# Patient Record
Sex: Female | Born: 1960 | Race: White | Hispanic: No | Smoking: Current every day smoker
Health system: Southern US, Community
[De-identification: ages and names within clinical notes are randomized; demographics above are authoritative.]

## PROBLEM LIST (undated history)

## (undated) DIAGNOSIS — Z86010 Personal history of colonic polyps: Secondary | ICD-10-CM

## (undated) DIAGNOSIS — Z8619 Personal history of other infectious and parasitic diseases: Secondary | ICD-10-CM

## (undated) DIAGNOSIS — E785 Hyperlipidemia, unspecified: Secondary | ICD-10-CM

## (undated) DIAGNOSIS — K219 Gastro-esophageal reflux disease without esophagitis: Secondary | ICD-10-CM

## (undated) HISTORY — DX: Personal history of colonic polyps: Z86.010

## (undated) HISTORY — DX: Personal history of other infectious and parasitic diseases: Z86.19

## (undated) HISTORY — DX: Gastro-esophageal reflux disease without esophagitis: K21.9

## (undated) HISTORY — DX: Hyperlipidemia, unspecified: E78.5

---
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
## 1999-08-05 ENCOUNTER — Inpatient Hospital Stay (HOSPITAL_COMMUNITY): Admission: AD | Admit: 1999-08-05 | Discharge: 1999-08-05 | Payer: Self-pay | Admitting: Obstetrics & Gynecology

## 1999-10-08 ENCOUNTER — Other Ambulatory Visit: Admission: RE | Admit: 1999-10-08 | Discharge: 1999-10-08 | Payer: Self-pay | Admitting: Obstetrics and Gynecology

## 2002-02-01 ENCOUNTER — Other Ambulatory Visit: Admission: RE | Admit: 2002-02-01 | Discharge: 2002-02-01 | Payer: Self-pay | Admitting: Obstetrics and Gynecology

## 2003-09-03 ENCOUNTER — Other Ambulatory Visit: Admission: RE | Admit: 2003-09-03 | Discharge: 2003-09-03 | Payer: Self-pay | Admitting: Obstetrics and Gynecology

## 2005-01-10 ENCOUNTER — Other Ambulatory Visit: Admission: RE | Admit: 2005-01-10 | Discharge: 2005-01-10 | Payer: Self-pay | Admitting: Obstetrics and Gynecology

## 2005-01-27 ENCOUNTER — Encounter: Admission: RE | Admit: 2005-01-27 | Discharge: 2005-01-27 | Payer: Self-pay | Admitting: Obstetrics and Gynecology

## 2010-12-05 DIAGNOSIS — Z860100 Personal history of colon polyps, unspecified: Secondary | ICD-10-CM

## 2010-12-05 HISTORY — DX: Personal history of colon polyps, unspecified: Z86.0100

## 2010-12-05 HISTORY — DX: Personal history of colonic polyps: Z86.010

## 2010-12-26 ENCOUNTER — Encounter: Payer: Self-pay | Admitting: Obstetrics and Gynecology

## 2015-08-07 ENCOUNTER — Ambulatory Visit (INDEPENDENT_AMBULATORY_CARE_PROVIDER_SITE_OTHER): Payer: 59 | Admitting: Family

## 2015-08-07 ENCOUNTER — Encounter: Payer: Self-pay | Admitting: Family

## 2015-08-07 VITALS — BP 124/80 | HR 55 | Temp 97.7°F | Resp 16 | Ht 67.5 in | Wt 268.4 lb

## 2015-08-07 DIAGNOSIS — K219 Gastro-esophageal reflux disease without esophagitis: Secondary | ICD-10-CM | POA: Diagnosis not present

## 2015-08-07 DIAGNOSIS — E785 Hyperlipidemia, unspecified: Secondary | ICD-10-CM | POA: Diagnosis not present

## 2015-08-07 DIAGNOSIS — E669 Obesity, unspecified: Secondary | ICD-10-CM | POA: Diagnosis not present

## 2015-08-07 DIAGNOSIS — R202 Paresthesia of skin: Secondary | ICD-10-CM

## 2015-08-07 DIAGNOSIS — Z23 Encounter for immunization: Secondary | ICD-10-CM | POA: Diagnosis not present

## 2015-08-07 LAB — LIPID PANEL
Cholesterol: 288 mg/dL — ABNORMAL HIGH (ref 0–200)
HDL: 45.8 mg/dL (ref >39.00)
LDL CALC: 203 mg/dL — AB (ref 0–99)
NONHDL: 242.44
Total CHOL/HDL Ratio: 6
Triglycerides: 197 mg/dL — ABNORMAL HIGH (ref 0.0–149.0)
VLDL: 39.4 mg/dL (ref 0.0–40.0)

## 2015-08-07 LAB — VITAMIN B12: Vitamin B-12: 269 pg/mL (ref 211–911)

## 2015-08-07 LAB — FOLATE: FOLATE: 17.1 ng/mL (ref >5.9)

## 2015-08-07 NOTE — Progress Notes (Signed)
   Subjective:    Patient ID: Kayla Lamb, female    DOB: [birth year], 54 y.o.   MRN: [id number]  HPI  Kayla Lamb is a 54 yr old female who presents today to establish care.  She has only had a GYN  Hyperlipidemia- reports on statin for 10 years. Feet recently became numb/tingly. GYN told her to stop statin and her symptoms improved.  She stopped statin 6 weeks ago.    Obese- not currently exercising.  + work stress.    GERD- uses otc Pepcid with good relief. Review of Systems See HPI  Past Medical History  Diagnosis Date  . History of chicken pox   . GERD (gastroesophageal reflux disease)   . Hyperlipidemia   . History of colon polyps 2012    every 5 yrs per pt    Social History   Social History  . Marital Status: Divorced    Spouse Name: N/A  . Number of Children: N/A  . Years of Education: N/A   Occupational History  . Not on file.   Social History Main Topics  . Smoking status: Current Every Day Smoker -- 1.00 packs/day for 30 years    Types: Cigarettes  . Smokeless tobacco: Not on file  . Alcohol Use: Not on file     Comment:  5 drinks on weekend "social"  . Drug Use: Not on file  . Sexual Activity: Not on file   Other Topics Concern  . Not on file   Social History Narrative    History reviewed. No pertinent past surgical history.  Family History  Problem Relation Age of Onset  . Sjogren's syndrome Mother   . Neuropathy Mother   . Hyperlipidemia Mother   . Heart disease Father 44    just got pace maker  . Cancer Paternal Grandmother     hodgkin's  . Lymphoma Paternal Grandmother   . Heart disease Paternal Grandmother   . Heart disease Paternal Grandfather   . Hyperlipidemia Paternal Grandfather     No Known Allergies  No current outpatient prescriptions on file prior to visit.   No current facility-administered medications on file prior to visit.    BP 124/80 mmHg  Pulse 55  Temp(Src) 97.7 F (36.5 C) (Oral)  Resp 16  Ht 5' 7.5"  (1.715 m)  Wt 268 lb 6.4 oz (121.745 kg)  BMI 41.39 kg/m2  SpO2 98%  LMP 08/06/2012       Objective:   Physical Exam  Constitutional: She appears well-developed and well-nourished.  Cardiovascular: Normal rate, regular rhythm and normal heart sounds.   No murmur heard. Pulmonary/Chest: Effort normal and breath sounds normal. No respiratory distress. She has no wheezes.  Musculoskeletal: She exhibits no edema.  Psychiatric: She has a normal mood and affect. Her behavior is normal. Judgment and thought content normal.          Assessment & Plan:  Paresthesias- check b12 and folate

## 2015-08-07 NOTE — Progress Notes (Signed)
Pre visit review using our clinic review tool, if applicable. No additional management support is needed unless otherwise documented below in the visit note. 

## 2015-08-07 NOTE — Assessment & Plan Note (Signed)
Pt was counseled on diet, exercise, weight loss. We discussed using my fitness pal as a tool to help her.  30 min spent with pt today. >50% of time was spent counseling pt on weight loss, nutrition, exercise.

## 2015-08-07 NOTE — Assessment & Plan Note (Signed)
Stable on pepcid. Continue same, discussed importance of diet in gerd prevention.

## 2015-08-07 NOTE — Patient Instructions (Addendum)
Please download Myfitness Pal- goal weight loss 1-2 pounds per week.  Complete lab work prior to leaving. Schedule a complete physical at the front desk.  Welcome to [facility]!

## 2015-08-07 NOTE — Assessment & Plan Note (Signed)
Wants to remain off of statin. Obtain lipid panel

## 2015-08-10 ENCOUNTER — Telehealth: Payer: Self-pay | Admitting: Family

## 2015-08-10 DIAGNOSIS — E785 Hyperlipidemia, unspecified: Secondary | ICD-10-CM

## 2015-08-10 MED ORDER — B-12 1000 MCG PO CAPS: 1.0000 | ORAL_CAPSULE | Freq: Every day | ORAL | Status: DC

## 2015-08-10 NOTE — Telephone Encounter (Signed)
Cholesterol is extremely high. b12 is low normal. I would recommend that she add an otc oral b12 supplement once daily.  This may help with some of the tingling. Since her cholesterol is so high, I would recommend that she restart statin and see if her tingling worsens.  If so d/c statin and let me know.

## 2015-08-11 NOTE — Telephone Encounter (Signed)
Attempted to reach pt, # busy, no voicemail received.

## 2015-08-12 NOTE — Telephone Encounter (Signed)
Attempted to reach pt, line busy, no voicemail.

## 2015-08-14 NOTE — Telephone Encounter (Signed)
Left message on work # for pt to return my call.

## 2015-08-19 NOTE — Telephone Encounter (Signed)
Notified pt and she voices understanding. Still has atorvastatin on hand. Pt states she will call back to schedule a physical and wants to know when you want to recheck her cholesterol levels?

## 2015-08-19 NOTE — Telephone Encounter (Signed)
Lets repeat in 6 weeks please.

## 2015-08-19 NOTE — Telephone Encounter (Signed)
Notified pt and scheduled lab appt for 09/30/15 at 7:15 am.  Future order entered.

## 2015-09-16 ENCOUNTER — Encounter: Payer: Self-pay | Admitting: Family

## 2015-09-16 ENCOUNTER — Ambulatory Visit (INDEPENDENT_AMBULATORY_CARE_PROVIDER_SITE_OTHER): Payer: 59 | Admitting: Family

## 2015-09-16 VITALS — BP 132/59 | HR 72 | Temp 98.7°F | Resp 18 | Ht 67.5 in | Wt 261.4 lb

## 2015-09-16 DIAGNOSIS — H6692 Otitis media, unspecified, left ear: Secondary | ICD-10-CM

## 2015-09-16 DIAGNOSIS — J209 Acute bronchitis, unspecified: Secondary | ICD-10-CM | POA: Diagnosis not present

## 2015-09-16 MED ORDER — ALBUTEROL SULFATE HFA 108 (90 BASE) MCG/ACT IN AERS: 2.0000 | INHALATION_SPRAY | Freq: Four times a day (QID) | RESPIRATORY_TRACT | Status: DC | PRN

## 2015-09-16 MED ORDER — AZITHROMYCIN 250 MG PO TABS: ORAL_TABLET | ORAL | Status: DC

## 2015-09-16 MED ORDER — PREDNISONE 10 MG PO TABS: ORAL_TABLET | ORAL | Status: DC

## 2015-09-16 NOTE — Patient Instructions (Addendum)
Please start zpak, albuterol and prednisone. Call if symptoms worsen, if you develop fever, or if not improved in 2-3- days.

## 2015-09-16 NOTE — Progress Notes (Signed)
Subjective:    Patient ID: Lorna FewBeverly A Lame, female    DOB: 12/28/1960, 54 y.o.   MRN: [id number]  HPI  Ms. Joesphine BareGreeson is a 54 yr old female who presents today with chief complaint of cough  She reports + severe. Chest congestion started on Sunday. Nasal congestion started Monday.  She feels weak/cold but does not have a thermometer. +anorexia.  She reports bilateral ear pressure.  + facial pressure, + pain in her teeth.  She has tried an otc cough med without improvement.    Review of Systems See HPI  Past Medical History  Diagnosis Date  . History of chicken pox   . GERD (gastroesophageal reflux disease)   . Hyperlipidemia   . History of colon polyps 2012    every 5 yrs per pt    Social History   Social History  . Marital Status: Divorced    Spouse Name: N/A  . Number of Children: N/A  . Years of Education: N/A   Occupational History  . Not on file.   Social History Main Topics  . Smoking status: Current Every Day Smoker -- 1.00 packs/day for 30 years    Types: Cigarettes  . Smokeless tobacco: Not on file  . Alcohol Use: Not on file     Comment:  5 drinks on weekend "social"  . Drug Use: Not on file  . Sexual Activity: Not on file   Other Topics Concern  . Not on file   Social History Narrative   Divorced   1 daughter- 54 years old   Works in [employer] ([employer] of [employer])   Scientist, research (physical sciences)Associates Degree in Business   Enjoys spending time with her dog and spending time with friends.     Past Surgical History  Procedure Laterality Date  . Pilonidal cyst excision  1980    Family History  Problem Relation Age of Onset  . Sjogren's syndrome Mother     adopted  . Neuropathy Mother   . Hyperlipidemia Mother   . Heart disease Father [id number]    just got pace maker  . Cancer Paternal Grandmother     hodgkin's  . Lymphoma Paternal Grandmother   . Heart disease Paternal Grandmother   . Heart disease Paternal Grandfather   . Hyperlipidemia Paternal Grandfather     No Known  Allergies  Current Outpatient Prescriptions on File Prior to Visit  Medication Sig Dispense Refill  . atorvastatin (LIPITOR) 20 MG tablet Take 20 mg by mouth daily.    . cetirizine (ZYRTEC) 10 MG tablet Take 10 mg by mouth daily.    . Cyanocobalamin (B-12) 1000 MCG CAPS Take 1 capsule by mouth daily.     No current facility-administered medications on file prior to visit.    BP 132/59 mmHg  Pulse 72  Temp(Src) 98.7 F (37.1 C) (Oral)  Resp 18  Ht 5' 7.5" (1.715 m)  Wt 261 lb 6.4 oz (118.57 kg)  BMI 40.31 kg/m2  SpO2 96%  LMP 08/06/2012       Objective:   Physical Exam  Constitutional: She is oriented to person, place, and time. She appears well-developed and well-nourished.  HENT:  Head: Normocephalic and atraumatic.  Right Ear: Tympanic membrane and ear canal normal.  Left Ear: Ear canal normal. Tympanic membrane is erythematous.  Mouth/Throat: Posterior oropharyngeal erythema present. No posterior oropharyngeal edema.  Cardiovascular: Normal rate and regular rhythm.   No murmur heard. Pulmonary/Chest: Effort normal. No respiratory distress.  Bilateral wheezing noted L>R  Musculoskeletal:  She exhibits no edema.  Neurological: She is alert and oriented to person, place, and time.  Skin: Skin is warm and dry.  Psychiatric: She has a normal mood and affect. Her behavior is normal. Judgment and thought content normal.          Assessment & Plan:  Acute bronchitis with bronchospasm- rx with zpak, albuterol steroid taper.  Early L otitis media- rx with zpak.

## 2015-09-16 NOTE — Progress Notes (Signed)
Pre visit review using our clinic review tool, if applicable. No additional management support is needed unless otherwise documented below in the visit note. 

## 2015-09-30 ENCOUNTER — Telehealth: Payer: Self-pay | Admitting: Family

## 2015-09-30 ENCOUNTER — Other Ambulatory Visit (INDEPENDENT_AMBULATORY_CARE_PROVIDER_SITE_OTHER): Payer: 59

## 2015-09-30 DIAGNOSIS — E785 Hyperlipidemia, unspecified: Secondary | ICD-10-CM | POA: Diagnosis not present

## 2015-09-30 LAB — LIPID PANEL
CHOL/HDL RATIO: 5
Cholesterol: 201 mg/dL — ABNORMAL HIGH (ref 0–200)
HDL: 43.8 mg/dL (ref >39.00)
NONHDL: 156.95
TRIGLYCERIDES: 204 mg/dL — AB (ref 0.0–149.0)
VLDL: 40.8 mg/dL — ABNORMAL HIGH (ref 0.0–40.0)

## 2015-09-30 LAB — LDL CHOLESTEROL, DIRECT: Direct LDL: 114 mg/dL

## 2015-09-30 MED ORDER — ATORVASTATIN CALCIUM 40 MG PO TABS: 40.0000 mg | ORAL_TABLET | Freq: Every day | ORAL | Status: DC

## 2015-09-30 NOTE — Telephone Encounter (Signed)
Cholesterol has improved dramatically, but still slightly above goal. Increase lipitor from 20mg  to 40mg . Continue low fat/low cholesterol diet, exercise,weight loss. Repeat lipids in 3 months, dx hyperlipidemia.

## 2015-10-01 NOTE — Telephone Encounter (Signed)
Spoke with pt and she stated she was not fasting at the time and is uneasy about bumping up the dose of Lipitor from 20mg  to 40 mg. Pt would like a call back tomorrow. She is willing to come back in three months for repeat blood work. Please advise.

## 2015-10-02 MED ORDER — ATORVASTATIN CALCIUM 20 MG PO TABS: 20.0000 mg | ORAL_TABLET | Freq: Every day | ORAL | Status: DC

## 2015-10-02 NOTE — Telephone Encounter (Signed)
OK.   I already sent the 40's could you please cancel? thanks

## 2015-10-02 NOTE — Telephone Encounter (Signed)
Notified pt and scheduled lab appt for 01/04/16. Sent refill of 20mg  atorvastatin and cancelled 40mg  dose with Feliz Beamravis at [facility]. Future lab order already in system.

## 2016-01-04 ENCOUNTER — Other Ambulatory Visit (INDEPENDENT_AMBULATORY_CARE_PROVIDER_SITE_OTHER): Payer: 59

## 2016-01-04 DIAGNOSIS — E785 Hyperlipidemia, unspecified: Secondary | ICD-10-CM

## 2016-01-04 LAB — LIPID PANEL
Cholesterol: 207 mg/dL — ABNORMAL HIGH (ref 0–200)
HDL: 43.2 mg/dL (ref >39.00)
NONHDL: 163.49
TRIGLYCERIDES: 226 mg/dL — AB (ref 0.0–149.0)
Total CHOL/HDL Ratio: 5
VLDL: 45.2 mg/dL — AB (ref 0.0–40.0)

## 2016-01-04 LAB — LDL CHOLESTEROL, DIRECT: LDL DIRECT: 135 mg/dL

## 2016-01-05 ENCOUNTER — Encounter: Payer: Self-pay | Admitting: Family

## 2016-01-05 MED ORDER — FISH OIL CONCENTRATE 1000 MG PO CAPS: 2.0000 | ORAL_CAPSULE | Freq: Two times a day (BID) | ORAL | Status: DC

## 2018-12-10 ENCOUNTER — Ambulatory Visit (HOSPITAL_BASED_OUTPATIENT_CLINIC_OR_DEPARTMENT_OTHER)
Admission: RE | Admit: 2018-12-10 | Discharge: 2018-12-10 | Disposition: A | Discharge status: Home / Self Care | Payer: 59 | Source: Ambulatory Visit | Attending: Family | Admitting: Family

## 2018-12-10 ENCOUNTER — Ambulatory Visit (INDEPENDENT_AMBULATORY_CARE_PROVIDER_SITE_OTHER): Payer: 59 | Admitting: Family

## 2018-12-10 ENCOUNTER — Encounter: Payer: Self-pay | Admitting: Family

## 2018-12-10 ENCOUNTER — Telehealth: Payer: Self-pay | Admitting: Family

## 2018-12-10 VITALS — BP 118/63 | HR 54 | Temp 99.1°F | Resp 16 | Ht 68.0 in | Wt 222.0 lb

## 2018-12-10 DIAGNOSIS — M79672 Pain in left foot: Secondary | ICD-10-CM | POA: Diagnosis not present

## 2018-12-10 DIAGNOSIS — S82832A Other fracture of upper and lower end of left fibula, initial encounter for closed fracture: Secondary | ICD-10-CM

## 2018-12-10 NOTE — Telephone Encounter (Signed)
Reviewed films with Dr. Pearletha Forge sports med. He will see her for consult in his office.  Attempted to reach patient on cell- left detailed message regarding results and need to see Dr. Pearletha Forge as well as Dr. Lazaro Arms information.

## 2018-12-10 NOTE — Patient Instructions (Signed)
Please complete x ray prior to leaving. We will contact you with your results.  

## 2018-12-10 NOTE — Progress Notes (Signed)
Subjective:    Patient ID: Kayla Lamb, female    DOB: 03/17/1961, 58 y.o.   MRN: [id number]  HPI  Patient is a 58 year old female who presents today with chief complaint of left-sided ankle pain.  She reports the pain began following a fall which occurred on 12/08/2018. Reports that she slipped on Saturday night.  Has been using ibuprofen prn.    Wt Readings from Last 3 Encounters:  12/10/18 222 lb (100.7 kg)  09/16/15 261 lb 6.4 oz (118.6 kg)  08/07/15 268 lb 6.4 oz (121.7 kg)     Review of Systems See HPI  Past Medical History:  Diagnosis Date  . GERD (gastroesophageal reflux disease)   . History of chicken pox   . History of colon polyps 2012   every 5 yrs per pt  . Hyperlipidemia      Social History   Socioeconomic History  . Marital status: Divorced    Spouse name: Not on file  . Number of children: Not on file  . Years of education: Not on file  . Highest education level: Not on file  Occupational History  . Not on file  Social Needs  . Financial resource strain: Not on file  . Food insecurity:    Worry: Not on file    Inability: Not on file  . Transportation needs:    Medical: Not on file    Non-medical: Not on file  Tobacco Use  . Smoking status: Current Every Day Smoker    Packs/day: 1.00    Years: 30.00    Pack years: 30.00    Types: Cigarettes  Substance and Sexual Activity  . Alcohol use: Yes    Comment:  5 drinks on weekend "social"  . Drug use: Never  . Sexual activity: Not Currently  Lifestyle  . Physical activity:    Days per week: Not on file    Minutes per session: Not on file  . Stress: Not on file  Relationships  . Social connections:    Talks on phone: Not on file    Gets together: Not on file    Attends religious service: Not on file    Active member of club or organization: Not on file    Attends meetings of clubs or organizations: Not on file    Relationship status: Not on file  . Intimate partner violence:    Fear of  current or ex partner: Not on file    Emotionally abused: Not on file    Physically abused: Not on file    Forced sexual activity: Not on file  Other Topics Concern  . Not on file  Social History Narrative   Divorced   1 daughter- 58 years old   Works in [employer] ([employer] of [employer])   Scientist, research (physical sciences)Associates Degree in Business   Enjoys spending time with her dog and spending time with friends.     Past Surgical History:  Procedure Laterality Date  . PILONIDAL CYST EXCISION  1980    Family History  Problem Relation Age of Onset  . Sjogren's syndrome Mother        adopted  . Neuropathy Mother   . Hyperlipidemia Mother   . Heart disease Father [phone]       just got pace maker  . Cancer Paternal Grandmother        hodgkin's  . Lymphoma Paternal Grandmother   . Heart disease Paternal Grandmother   . Heart disease Paternal Grandfather   . Hyperlipidemia  Paternal Grandfather     No Known Allergies  Current Outpatient Medications on File Prior to Visit  Medication Sig Dispense Refill  . atorvastatin (LIPITOR) 20 MG tablet Take 1 tablet (20 mg total) by mouth daily. 90 tablet 1  . cetirizine (ZYRTEC) 10 MG tablet Take 10 mg by mouth daily.     No current facility-administered medications on file prior to visit.     BP 118/63 (BP Location: Right Arm, Patient Position: Sitting, Cuff Size: Large)   Pulse (!) 54   Temp 99.1 F (37.3 C) (Oral)   Resp 16   Ht 5\' 8"  (1.727 m)   Wt 222 lb (100.7 kg)   LMP 08/06/2012   SpO2 98%   BMI 33.75 kg/m       Objective:   Physical Exam Constitutional:      Appearance: She is well-developed.  Neck:     Musculoskeletal: Neck supple.     Thyroid: No thyromegaly.  Cardiovascular:     Rate and Rhythm: Normal rate and regular rhythm.     Heart sounds: Normal heart sounds. No murmur.  Pulmonary:     Effort: Pulmonary effort is normal. No respiratory distress.     Breath sounds: Normal breath sounds. No wheezing.  Musculoskeletal:     Comments: +  swelling of the left ankle/foot.   Skin:    General: Skin is warm and dry.  Neurological:     Mental Status: She is alert and oriented to person, place, and time.  Psychiatric:        Behavior: Behavior normal.        Thought Content: Thought content normal.        Judgment: Judgment normal.           Assessment & Plan:  L foot pain- suspect ankle fracture- obtain x-ray. Further recommendations following review of x-ray results.

## 2018-12-11 ENCOUNTER — Ambulatory Visit (INDEPENDENT_AMBULATORY_CARE_PROVIDER_SITE_OTHER): Payer: 59 | Admitting: Family Medicine

## 2018-12-11 VITALS — BP 134/78 | Ht 68.0 in | Wt 222.0 lb

## 2018-12-11 DIAGNOSIS — S99912A Unspecified injury of left ankle, initial encounter: Secondary | ICD-10-CM

## 2018-12-11 MED ORDER — DICLOFENAC SODIUM 75 MG PO TBEC: 75.0000 mg | DELAYED_RELEASE_TABLET | Freq: Two times a day (BID) | ORAL | 1 refills | Status: AC

## 2018-12-11 MED ORDER — HYDROCODONE-ACETAMINOPHEN 5-325 MG PO TABS: 1.0000 | ORAL_TABLET | Freq: Four times a day (QID) | ORAL | 0 refills | Status: DC | PRN

## 2018-12-11 NOTE — Patient Instructions (Signed)
You have a distal fibula fracture above the level of the ankle joint. Wear the boot at all times except to wash the area, ice it - I'd even wear it when sleeping if possible to protect it and for comfort. It's very important you do not put weight on this - crutches or knee scooter to get around. Icing 15 minutes at a time 3-4 times a day. Diclofenac 75mg  twice a day with food for pain and inflammation - do not take ibuprofen or aleve. Ok to take tylenol during the day.  Norco as needed in the evenings at bedtime (norco has tylenol in it). Topical biofreeze or aspercreme up to 4 times a day. Salon pas patches can help as well. Follow up with me in 2 weeks in [place] for reevaluation, repeat x-rays.

## 2018-12-12 ENCOUNTER — Encounter: Payer: Self-pay | Admitting: Family Medicine

## 2018-12-12 NOTE — Progress Notes (Signed)
PCP and consultation requested by: Sandford Craze'Sullivan, Melissa, NP  Subjective:   HPI: Patient is a 58 y.o. female here for left ankle injury.  Patient reports on Saturday she was walking her puppy in her yard when she slipped and fell she believes everting her ankle with the left leg being trapped behind her. She could not bear weight following this. She is continued to have severe pain only in the lateral ankle since then with associated swelling and some bruising that is a 3 out of 10 and more dull when she is at rest but up to 9 out of 10 and sharp when she is up using crutches. She still unable to put any weight on this. She reports sensation is a little less throughout the foot. No prior injuries. She is taking ibuprofen 200 mg every 4 hours with minimal benefit.  Past Medical History:  Diagnosis Date  . GERD (gastroesophageal reflux disease)   . History of chicken pox   . History of colon polyps 2012   every 5 yrs per pt  . Hyperlipidemia     Current Outpatient Medications on File Prior to Visit  Medication Sig Dispense Refill  . atorvastatin (LIPITOR) 20 MG tablet Take 1 tablet (20 mg total) by mouth daily. 90 tablet 1  . cetirizine (ZYRTEC) 10 MG tablet Take 10 mg by mouth daily.    Marland Kitchen. ibuprofen (ADVIL,MOTRIN) 200 MG tablet Take 200 mg by mouth every 4 (four) hours as needed.     No current facility-administered medications on file prior to visit.     Past Surgical History:  Procedure Laterality Date  . PILONIDAL CYST EXCISION  1980    No Known Allergies  Social History   Socioeconomic History  . Marital status: Divorced    Spouse name: Not on file  . Number of children: Not on file  . Years of education: Not on file  . Highest education level: Not on file  Occupational History  . Not on file  Social Needs  . Financial resource strain: Not on file  . Food insecurity:    Worry: Not on file    Inability: Not on file  . Transportation needs:    Medical: Not on  file    Non-medical: Not on file  Tobacco Use  . Smoking status: Current Every Day Smoker    Packs/day: 1.00    Years: 30.00    Pack years: 30.00    Types: Cigarettes  Substance and Sexual Activity  . Alcohol use: Yes    Comment:  5 drinks on weekend "social"  . Drug use: Never  . Sexual activity: Not Currently  Lifestyle  . Physical activity:    Days per week: Not on file    Minutes per session: Not on file  . Stress: Not on file  Relationships  . Social connections:    Talks on phone: Not on file    Gets together: Not on file    Attends religious service: Not on file    Active member of club or organization: Not on file    Attends meetings of clubs or organizations: Not on file    Relationship status: Not on file  . Intimate partner violence:    Fear of current or ex partner: Not on file    Emotionally abused: Not on file    Physically abused: Not on file    Forced sexual activity: Not on file  Other Topics Concern  . 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     Family History  Problem Relation Age of Onset  . Sjogren's syndrome Mother        adopted  . Neuropathy Mother   . Hyperlipidemia Mother   . Heart disease Father 89       just got pace maker  . Cancer Paternal Grandmother        hodgkin's  . Lymphoma Paternal Grandmother   . Heart disease Paternal Grandmother   . Heart disease Paternal Grandfather   . Hyperlipidemia Paternal Grandfather     BP 134/78   Ht 5\' 8"  (1.727 m)   Wt 222 lb (100.7 kg)   LMP 08/06/2012   BMI 33.75 kg/m   Review of Systems: See HPI above.     Objective:  Physical Exam:  Gen: NAD, comfortable in exam room  Left ankle: Mod swelling, some bruising primarily lateral ankle into foot.  No other deformity. Very limited motion due to swelling, pain.   FROM digits with 5/5 strength. TTP distal fibula.  No tenderness medially over malleolus or deltoid ligament.  No TTP navicular, base 5th. Mild pain laterally with syndesmotic compression superiorly. Thompsons test negative. NV intact distally.  Right ankle: No deformity. FROM with 5/5 strength. No tenderness to palpation. NVI distally.  MSK u/s medial ankle:  Two chronic well corticated fragments noted distal to medial malleolus.  Deltoid ligament without swelling or obvious disruption.   Assessment & Plan:  1. Left ankle injury - independently reviewed radiographs and noted distal fibula fracture proximal to level of ankle joint.  No evidence of medial disruption by radiographs, ultrasound, exam to suggest unstable ankle requiring surgical intervention.  We discussed importance of non-weight bearing with this type of fracture until this heals and increased risk of nonunion especially with her smoking history.  She verbalized understanding.  Cam walker, knee scooter.  Icing, diclofenac with tylenol during day and norco in evenings.  Topical medications reviewed also.  F/u in 2 weeks for reevaluation, repeat x-rays.  Expect 6-8 weeks for this to heal.

## 2018-12-26 ENCOUNTER — Ambulatory Visit (HOSPITAL_BASED_OUTPATIENT_CLINIC_OR_DEPARTMENT_OTHER)
Admission: RE | Admit: 2018-12-26 | Discharge: 2018-12-26 | Disposition: A | Discharge status: Home / Self Care | Payer: 59 | Source: Ambulatory Visit | Attending: Family Medicine | Admitting: Family Medicine

## 2018-12-26 ENCOUNTER — Ambulatory Visit (INDEPENDENT_AMBULATORY_CARE_PROVIDER_SITE_OTHER): Payer: 59 | Admitting: Family Medicine

## 2018-12-26 ENCOUNTER — Encounter: Payer: Self-pay | Admitting: Family Medicine

## 2018-12-26 VITALS — BP 170/80 | HR 51 | Ht 68.0 in | Wt 235.0 lb

## 2018-12-26 DIAGNOSIS — S82899A Other fracture of unspecified lower leg, initial encounter for closed fracture: Secondary | ICD-10-CM | POA: Diagnosis not present

## 2018-12-26 DIAGNOSIS — S99912D Unspecified injury of left ankle, subsequent encounter: Secondary | ICD-10-CM

## 2018-12-26 NOTE — Addendum Note (Signed)
Addended by: Kathi Simpers F on: 12/26/2018 02:35 PM   Modules accepted: Orders

## 2018-12-26 NOTE — Patient Instructions (Signed)
We will go ahead with an MRI of your ankle to assess the integrity of the medial ankle ligament - I will call you with the results and next steps - if this is intact we will continue with the conservative treatment. If it's torn along with your fracture we will send you to the surgeon. Wear the boot at all times except to wash the area, ice it - I'd even wear it when sleeping if possible to protect it and for comfort. It's very important you do not put weight on this - crutches or knee scooter to get around. Icing 15 minutes at a time 3-4 times a day. Diclofenac 75mg  twice a day with food for pain and inflammation - do not take ibuprofen or aleve. Ok to take tylenol during the day.  Norco as needed in the evenings at bedtime (norco has tylenol in it) - let me know if you need another prescription of this. Topical biofreeze or aspercreme up to 4 times a day. Salon pas patches can help as well.

## 2018-12-26 NOTE — Progress Notes (Signed)
PCP and consultation requested by: Sandford Craze, NP  Subjective:   HPI: Patient is a 58 y.o. female here for left ankle injury.  1/7: Patient reports on Saturday she was walking her puppy in her yard when she slipped and fell she believes everting her ankle with the left leg being trapped behind her. She could not bear weight following this. She is continued to have severe pain only in the lateral ankle since then with associated swelling and some bruising that is a 3 out of 10 and more dull when she is at rest but up to 9 out of 10 and sharp when she is up using crutches. She still unable to put any weight on this. She reports sensation is a little less throughout the foot. No prior injuries. She is taking ibuprofen 200 mg every 4 hours with minimal benefit.  1/22: Patient returns reporting pain at 3/10 level lateral ankle. Denies medial ankle pain. Has been icing, elevating. Wearing boot regularly and using knee scooter. Fell down once and also one time felt she put some weight on this leg, each increased pain at the time. No skin changes, numbness.  Past Medical History:  Diagnosis Date  . GERD (gastroesophageal reflux disease)   . History of chicken pox   . History of colon polyps 2012   every 5 yrs per pt  . Hyperlipidemia     Current Outpatient Medications on File Prior to Visit  Medication Sig Dispense Refill  . cetirizine (ZYRTEC) 10 MG tablet Take 10 mg by mouth daily.    . diclofenac (VOLTAREN) 75 MG EC tablet Take 1 tablet (75 mg total) by mouth 2 (two) times daily. 60 tablet 1  . HYDROcodone-acetaminophen (NORCO) 5-325 MG tablet Take 1 tablet by mouth every 6 (six) hours as needed for moderate pain. 20 tablet 0  . ibuprofen (ADVIL,MOTRIN) 200 MG tablet Take 200 mg by mouth every 4 (four) hours as needed.    . rosuvastatin (CRESTOR) 20 MG tablet      No current facility-administered medications on file prior to visit.     Past Surgical History:   Procedure Laterality Date  . PILONIDAL CYST EXCISION  1980    No Known Allergies  Social History   Socioeconomic History  . Marital status: Divorced    Spouse name: Not on file  . Number of children: Not on file  . Years of education: Not on file  . Highest education level: Not on file  Occupational History  . Not on file  Social Needs  . Financial resource strain: Not on file  . Food insecurity:    Worry: Not on file    Inability: Not on file  . Transportation needs:    Medical: Not on file    Non-medical: Not on file  Tobacco Use  . Smoking status: Current Every Day Smoker    Packs/day: 1.00    Years: 30.00    Pack years: 30.00    Types: Cigarettes  . Smokeless tobacco: Never Used  Substance and Sexual Activity  . Alcohol use: Yes    Comment:  5 drinks on weekend "social"  . Drug use: Never  . Sexual activity: Not Currently  Lifestyle  . Physical activity:    Days per week: Not on file    Minutes per session: Not on file  . Stress: Not on file  Relationships  . Social connections:    Talks on phone: Not on file    Gets together: Not  on file    Attends religious service: Not on file    Active member of club or organization: Not on file    Attends meetings of clubs or organizations: Not on file    Relationship status: Not on file  . Intimate partner violence:    Fear of current or ex partner: Not on file    Emotionally abused: Not on file    Physically abused: Not on file    Forced sexual activity: Not on file  Other Topics Concern  . 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     Family History  Problem Relation Age of Onset  . Sjogren's syndrome Mother        adopted  . Neuropathy Mother   . Hyperlipidemia Mother   . Heart disease Father 86       just got pace maker  .  Cancer Paternal Grandmother        hodgkin's  . Lymphoma Paternal Grandmother   . Heart disease Paternal Grandmother   . Heart disease Paternal Grandfather   . Hyperlipidemia Paternal Grandfather     Pulse (!) 51   Ht 5\' 8"  (1.727 m)   Wt 235 lb (106.6 kg)   LMP 08/06/2012   BMI 35.73 kg/m   Review of Systems: See HPI above.     Objective:  Physical Exam:  Gen: NAD, comfortable in exam room  Left ankle: Mild swelling lateral ankle.  No bruising, other deformity. Very limited motion due to swelling, pain.  FROM digits with 5/5 strength. TTP distal fibula.  No tenderness medial malleolus, deltoid ligament.  No TTP navicular, base 5th, fibular head. Thompsons test negative. NV intact distally.   Assessment & Plan:  1. Left ankle injury - independently reviewed repeat radiographs today - fracture appears to have slightly displaced compared to last visit.  Also appears to have slight widening medial ankle.  She hasn't had pain medially and again no tenderness here but given this concern and fibula fracture being above level of ankle joint advised we go ahead with urgent MRI to assess deltoid ligament.  Continue non-weight bearing.  Diclofenac with tylenol during day, norco in evenings.  Icing.  Will call with results and next steps.

## 2018-12-30 ENCOUNTER — Ambulatory Visit
Admission: RE | Admit: 2018-12-30 | Discharge: 2018-12-30 | Disposition: A | Discharge status: Home / Self Care | Payer: 59 | Source: Ambulatory Visit | Attending: Family Medicine | Admitting: Family Medicine

## 2018-12-30 DIAGNOSIS — S82899A Other fracture of unspecified lower leg, initial encounter for closed fracture: Secondary | ICD-10-CM

## 2019-01-11 ENCOUNTER — Ambulatory Visit (INDEPENDENT_AMBULATORY_CARE_PROVIDER_SITE_OTHER): Payer: 59 | Admitting: Family

## 2019-01-11 ENCOUNTER — Encounter: Payer: Self-pay | Admitting: Family

## 2019-01-11 VITALS — BP 147/74 | HR 55 | Temp 98.8°F | Resp 16 | Ht 68.0 in | Wt 235.0 lb

## 2019-01-11 DIAGNOSIS — R030 Elevated blood-pressure reading, without diagnosis of hypertension: Secondary | ICD-10-CM

## 2019-01-11 NOTE — Progress Notes (Signed)
   Subjective:    Patient ID: Kayla Lamb, female    DOB: Jul 25, 1961, 58 y.o.   MRN: [id number]  HPI  Patient is a 58 yr old female who presents today for blood pressure recheck. She states that her blood pressure has been running as high as 180 systolic last week. She believes that this is secondary to her use of  Voltaren. She took her last dose on 01/08/19.  She is scheduled for surgery on her ankle this afternoon and her podiatrist wanted her blood pressure rechecked prior to surgery.   Review of Systems     Objective:   Physical Exam Constitutional:      Appearance: She is well-developed.  Neck:     Musculoskeletal: Neck supple.     Thyroid: No thyromegaly.  Cardiovascular:     Rate and Rhythm: Normal rate and regular rhythm.     Heart sounds: Normal heart sounds. No murmur.  Pulmonary:     Effort: Pulmonary effort is normal. No respiratory distress.     Breath sounds: Normal breath sounds. No wheezing.  Skin:    General: Skin is warm and dry.  Neurological:     Mental Status: She is alert and oriented to person, place, and time.  Psychiatric:        Behavior: Behavior normal.        Thought Content: Thought content normal.        Judgment: Judgment normal.           Assessment & Plan:  Elevated blood pressure reading- blood pressure is improved today. I would recommend that she return to our office in 2 weeks for blood pressure recheck. If sbp remains greater than 140 would recommend addition of a low dose antihypertensive.   BP Readings from Last 3 Encounters:  01/11/19 (!) 147/74  12/26/18 (!) 170/80  12/11/18 134/78    A total of 15  minutes were spent face-to-face with the patient during this encounter and over half of that time was spent on counseling and coordination of care. The patient was counseled on hypertension and causes of elevated blood pressure, exercise and weight loss.

## 2019-05-22 IMAGING — DX DG ANKLE COMPLETE 3+V*L*
3 series · 3 of 3 positions shown · non-contrast
Comparison: 12/10/2018

CLINICAL DATA: Left ankle injury follow-up

EXAM:
LEFT ANKLE COMPLETE - 3+ VIEW

[ankle ap]
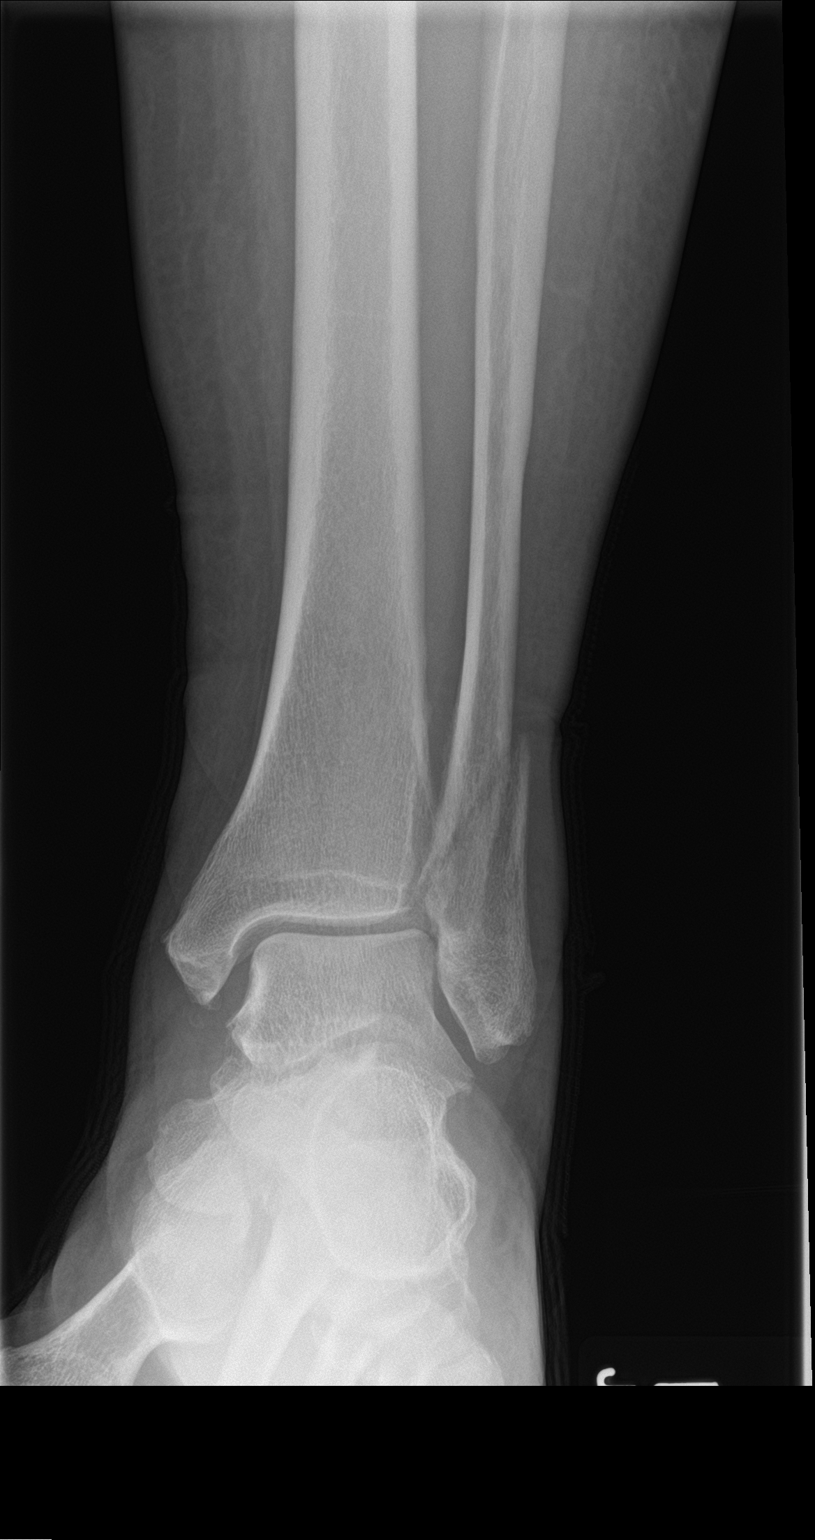

[ankle obl]
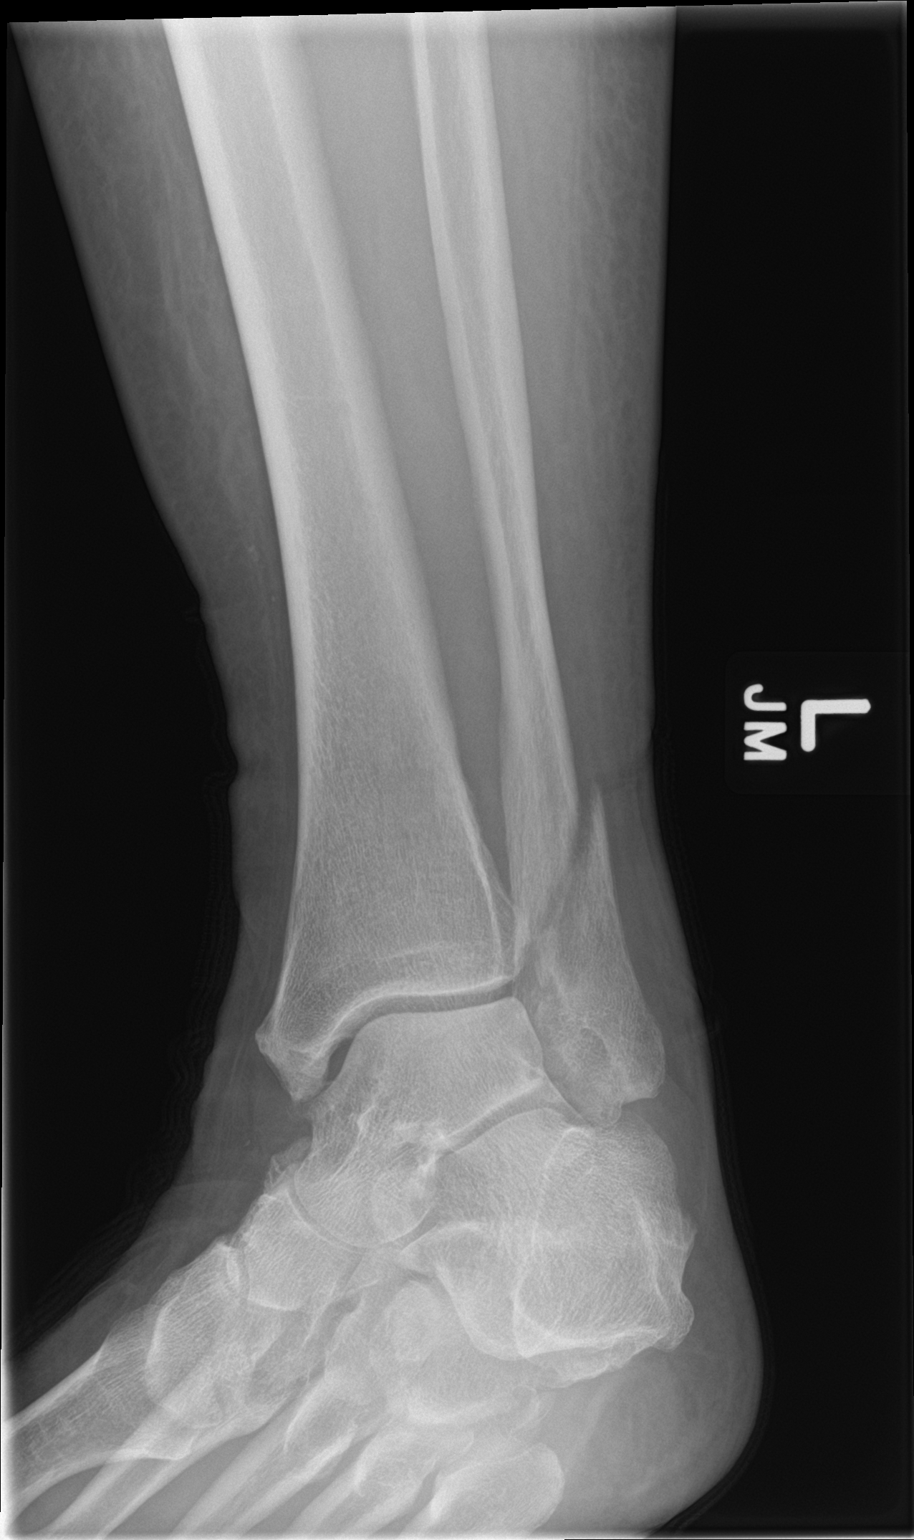

[ankle lat]
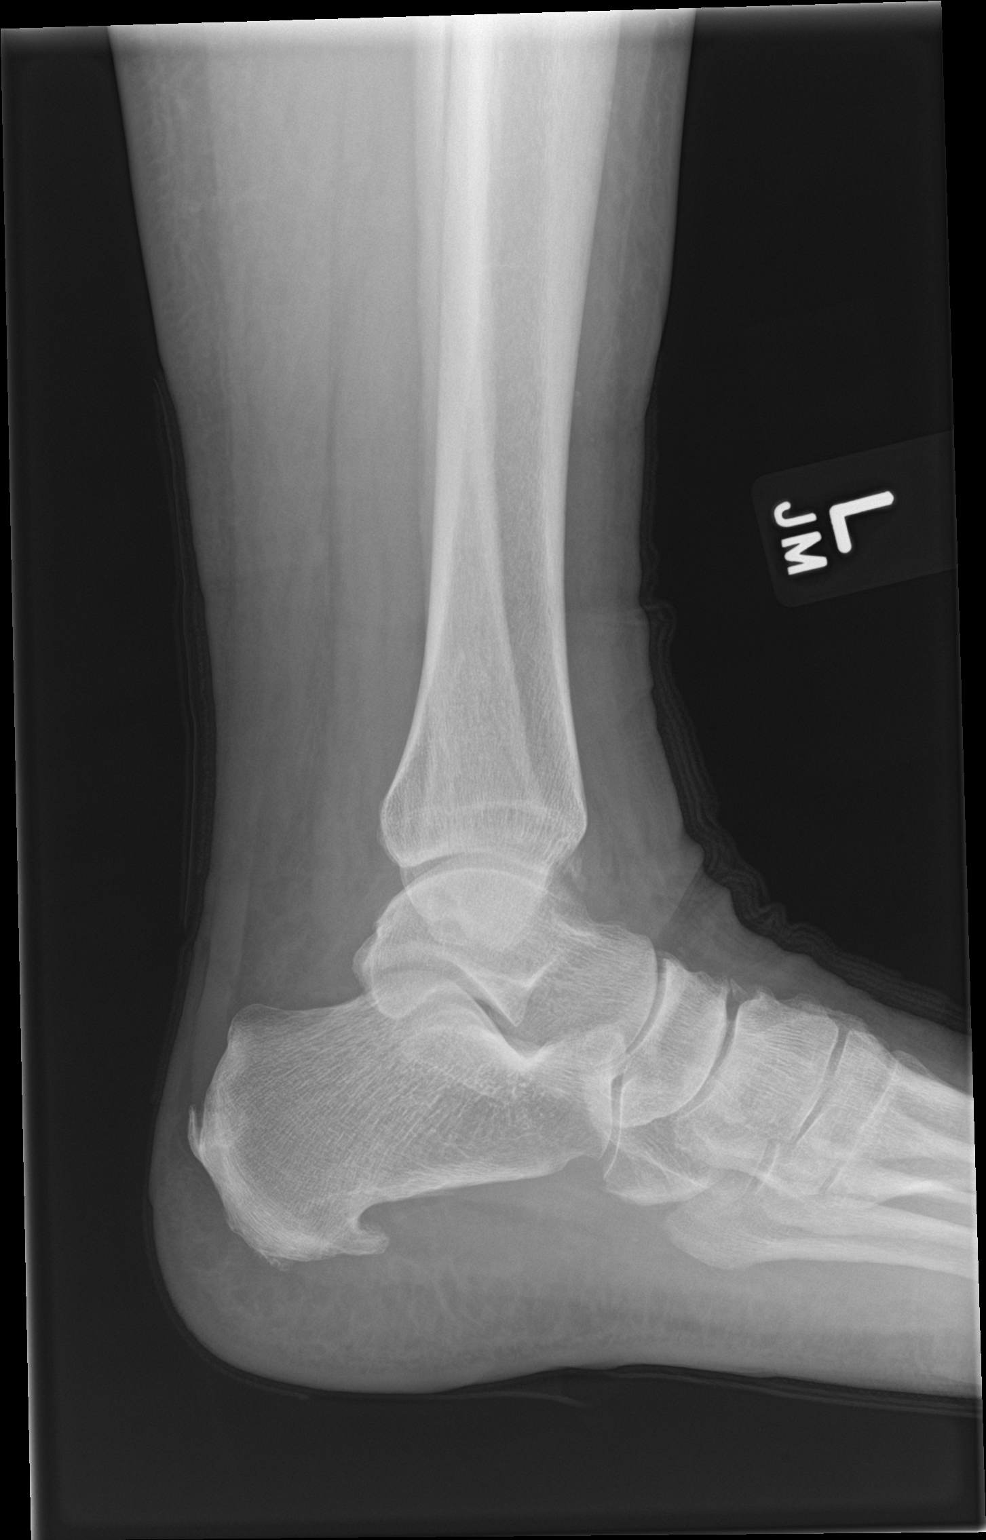

[3 of 3 positions shown; findings below may reference images not displayed]

FINDINGS: Subsequent encounter.

Oblique fracture through the distal fibula, at and above the ankle
joint. Indistinct fracture margins and subtle periosteal reaction
consistent with early healing. There is no bridging callus or change
in alignment (mild lateral displacement). Stable medial clear space
distance. Ankle joint effusion. Decreased soft tissue swelling heel
spurs.
IMPRESSION: 1. Unchanged alignment of distal fibular fracture which shows early
changes of healing.
2. Borderline medial clear space widening without change.

## 2022-01-25 ENCOUNTER — Encounter: Payer: Self-pay | Admitting: Family Medicine

## 2022-01-25 ENCOUNTER — Ambulatory Visit: Payer: 59 | Admitting: Family Medicine

## 2022-01-25 VITALS — BP 110/70 | HR 59 | Temp 98.8°F | Ht 68.0 in | Wt 224.4 lb

## 2022-01-25 DIAGNOSIS — J40 Bronchitis, not specified as acute or chronic: Secondary | ICD-10-CM | POA: Diagnosis not present

## 2022-01-25 DIAGNOSIS — T162XXA Foreign body in left ear, initial encounter: Secondary | ICD-10-CM | POA: Diagnosis not present

## 2022-01-25 MED ORDER — METHYLPREDNISOLONE ACETATE 80 MG/ML IJ SUSP
80.0000 mg | Freq: Once | INTRAMUSCULAR | Status: AC
  Administered 2022-01-25: 80 mg via INTRAMUSCULAR

## 2022-01-25 MED ORDER — BENZONATATE 200 MG PO CAPS: 200.0000 mg | ORAL_CAPSULE | Freq: Two times a day (BID) | ORAL | 0 refills | Status: AC | PRN

## 2022-01-25 MED ORDER — PROMETHAZINE-DM 6.25-15 MG/5ML PO SYRP: 5.0000 mL | ORAL_SOLUTION | Freq: Every evening | ORAL | 0 refills | Status: AC | PRN

## 2022-01-25 NOTE — Patient Instructions (Signed)
You can take the syrup 3 times daily if it does not make you drowsy.  Consider an air humidifier.   OK to take Tylenol 1000 mg (2 extra strength tabs) or 975 mg (3 regular strength tabs) every 6 hours as needed.  Continue to push fluids, practice good hand hygiene, and cover your mouth if you cough.  If you start having fevers, shaking or shortness of breath, seek immediate care.  Send me a message Th afternoon if no better, sooner if worsening.   Let us know if you need anything.

## 2022-01-25 NOTE — Progress Notes (Signed)
Chief Complaint  Patient presents with   Sinusitis   Cough    Lorna Few here for URI complaints.  Duration: 6 days  Associated symptoms: subjective fever, sinus headache, sinus congestion, rhinorrhea, wheezing, myalgia, and coughing Denies: sinus pain, itchy watery eyes, ear pain, ear drainage, sore throat, and N/V/D Treatment to date: none Sick contacts: No  Past Medical History:  Diagnosis Date   GERD (gastroesophageal reflux disease)    History of chicken pox    History of colon polyps 2012   every 5 yrs per pt   Hyperlipidemia     Objective BP 110/70    Pulse (!) 59    Temp 98.8 F (37.1 C) (Oral)    Ht 5\' 8"  (1.727 m)    Wt 224 lb 6 oz (101.8 kg)    LMP 08/06/2012    SpO2 94%    BMI 34.12 kg/m  General: Awake, alert, appears stated age HEENT: AT, [place], ears patent b/l and TM neg on R, slightly retracted on L without fluid or erythema, small FB (straw?) in L canal, nares patent w/o discharge, pharynx pink and without exudates, MMM Neck: No masses or asymmetry Heart: RRR Lungs: Slight wheezing at bases, no accessory muscle use Psych: Age appropriate judgment and insight, normal mood and affect  Procedure note: Simple foreign body removal of external left ear canal Verbal consent obtained. Procedure performed by 10/06/2012 [place], CMA The left canal was flushed with water and duplex. The foreign body was successfully removed. Turned out to be a piece of straw. The patient tolerated the procedure well with no immediate complications noted.  Wheezy bronchitis - Plan: promethazine-dextromethorphan (PROMETHAZINE-DM) 6.25-15 MG/5ML syrup, benzonatate (TESSALON) 200 MG capsule, methylPREDNISolone acetate (DEPO-MEDROL) injection 80 mg  Foreign body of left ear, initial encounter - Plan: PR REMV EXT CANAL FOREIGN BODY  Likely viral. Depo injection today for wheezing. Benzonatate prn during day, syrup at night. Continue to push fluids, practice good hand hygiene, cover mouth  when coughing. F/u prn. If starting to experience fevers, shaking, or shortness of breath, seek immediate care. Ear flushed with successful removal of FB.  Pt voiced understanding and agreement to the plan.  02-06-1991 [place], DO 01/25/22 8:57 AM
